# Patient Record
Sex: Male | Born: 1985 | Race: White | Hispanic: No | Marital: Single | Smoking: Current every day smoker
Health system: Southern US, Community
[De-identification: ages and names within clinical notes are randomized; demographics above are authoritative.]

## PROBLEM LIST (undated history)

## (undated) DIAGNOSIS — M539 Dorsopathy, unspecified: Secondary | ICD-10-CM

## (undated) HISTORY — PX: OTHER SURGICAL HISTORY: SHX169

## (undated) HISTORY — PX: PLEURAL SCARIFICATION: SHX748

## (undated) HISTORY — PX: TONSILLECTOMY: SUR1361

---
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
## 2007-05-15 ENCOUNTER — Ambulatory Visit (HOSPITAL_COMMUNITY): Admission: RE | Admit: 2007-05-15 | Discharge: 2007-05-15 | Payer: Self-pay | Admitting: Family Medicine

## 2008-03-24 ENCOUNTER — Ambulatory Visit (HOSPITAL_COMMUNITY): Admission: RE | Admit: 2008-03-24 | Discharge: 2008-03-24 | Payer: Self-pay | Admitting: Family Medicine

## 2010-08-10 ENCOUNTER — Emergency Department (HOSPITAL_COMMUNITY)
Admission: EM | Admit: 2010-08-10 | Discharge: 2010-08-11 | Disposition: A | Discharge status: Other Acute Inpatient Hospital | Payer: Self-pay | Attending: Emergency Medicine | Admitting: Emergency Medicine

## 2010-08-10 DIAGNOSIS — R45851 Suicidal ideations: Secondary | ICD-10-CM | POA: Insufficient documentation

## 2010-08-10 LAB — BASIC METABOLIC PANEL
BUN: 7 mg/dL (ref 6–23)
Calcium: 9.5 mg/dL (ref 8.4–10.5)
Creatinine, Ser: 1.3 mg/dL (ref 0.4–1.5)
GFR calc non Af Amer: >60 mL/min (ref >60)
Sodium: 142 mEq/L (ref 135–145)

## 2010-08-10 LAB — DIFFERENTIAL
Basophils Absolute: 0 10*3/uL (ref 0.0–0.1)
Basophils Relative: 0 % (ref 0–1)
Eosinophils Relative: 1 % (ref 0–5)
Neutrophils Relative %: 64 % (ref 43–77)

## 2010-08-10 LAB — RAPID URINE DRUG SCREEN, HOSP PERFORMED
Cocaine: NOT DETECTED
Opiates: NOT DETECTED
Tetrahydrocannabinol: POSITIVE — AB

## 2010-08-10 LAB — CBC
HCT: 47.3 % (ref 39.0–52.0)
Hemoglobin: 15.9 g/dL (ref 13.0–17.0)
MCH: 29.8 pg (ref 26.0–34.0)
WBC: 10.9 10*3/uL — ABNORMAL HIGH (ref 4.0–10.5)

## 2010-08-11 ENCOUNTER — Inpatient Hospital Stay (HOSPITAL_COMMUNITY)
Admission: EM | Admit: 2010-08-11 | Discharge: 2010-08-12 | DRG: 882 | Disposition: A | Discharge status: Home / Self Care | Payer: 59 | Source: Ambulatory Visit | Attending: Psychiatry | Admitting: Psychiatry

## 2010-08-11 DIAGNOSIS — S61509A Unspecified open wound of unspecified wrist, initial encounter: Secondary | ICD-10-CM

## 2010-08-11 DIAGNOSIS — F1220 Cannabis dependence, uncomplicated: Secondary | ICD-10-CM

## 2010-08-11 DIAGNOSIS — X838XXA Intentional self-harm by other specified means, initial encounter: Secondary | ICD-10-CM

## 2010-08-11 DIAGNOSIS — Z818 Family history of other mental and behavioral disorders: Secondary | ICD-10-CM

## 2010-08-11 DIAGNOSIS — F4325 Adjustment disorder with mixed disturbance of emotions and conduct: Secondary | ICD-10-CM

## 2010-08-11 DIAGNOSIS — F4323 Adjustment disorder with mixed anxiety and depressed mood: Principal | ICD-10-CM

## 2010-08-11 DIAGNOSIS — Z880 Allergy status to penicillin: Secondary | ICD-10-CM

## 2010-08-11 DIAGNOSIS — F1420 Cocaine dependence, uncomplicated: Secondary | ICD-10-CM

## 2010-08-11 DIAGNOSIS — F17200 Nicotine dependence, unspecified, uncomplicated: Secondary | ICD-10-CM

## 2010-08-11 NOTE — H&P (Signed)
Connor [name], Connor [name]              ACCOUNT NO.:  [id number]  MEDICAL RECORD NO.:  [id number]           PATIENT TYPE:  I  LOCATION:  0504                          FACILITY:  [facility]  PHYSICIAN:  Marlis Edelson, DO        DATE OF BIRTH:  08-Jun-1985  DATE OF ADMISSION:  08/11/2010 DATE OF DISCHARGE:                      PSYCHIATRIC ADMISSION ASSESSMENT   CHIEF COMPLAINT:  Cut self.  HISTORY OF THE CHIEF COMPLAINT:  Connor [name] is a 25 year old Caucasian male who was admitted to the [facility] on August 11, 2010 following transfer involuntary status from the [facility] emergency department.  He had presented to that department by police after cutting himself in what was believed to be a suicidal attempt.  It was related that he cut himself in an attempt to bleed out. He had been a 5-year relationship with a young lady who basically broke up with him.  He related that after this episode had occurred he was sitting in back of the house smoking marijuana, he was also drinking beer.  He reports that his mother and stepfather came over, began to make an issue about this breakup.  He states that he feels his mother is over protective about him, that his 25 years old and can care for himself but she tends to be he states over enmeshed with him.  He then picked up a knife and cut himself very superficially on the left wrist out of what he describes as frustration.  His mother called 9-1-1.  He left the premises and actually cut his arm worse when he jumped over a barbed wire fence at his friend's house.  He reports that he wanted to have died he would cut himself more seriously than a very superficial scratch.  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  If he wanted to commit suicide, he would have simply shot himself.  PAST PSYCHIATRIC HISTORY:  He states he was diagnosed with ADHD as a child.  He was provided with Adderall but he stated it made him  very despondent.  He has had no history of previous hospitalizations and he relates no history of suicide attempts.  He had no history of cutting himself to relieve the tension or pressure.  MEDICAL HISTORY:  Unremarkable.  MEDICATIONS:  None.  ALLERGIES:  PENICILLIN which causes anaphylactic shock, BENADRYL which causes nausea and vomiting.  SOCIAL HISTORY:  He is single, never married.  No children.  Education 9th grade and he did obtain a GED.  He does various odds and ends for work with no full-time employment and has no history of Financial planner.  No history of legal entanglement other than a concealed carry violation when he was younger.  He relates no history of childhood abuse.  FAMILY HISTORY:  Reports his mother is bipolar, that there is a lot of family illness, although he cannot be specific as to those illnesses among family members.  SUBSTANCE USE HISTORY:  He smokes 4 packs of cigarettes per day and has been smoking since the age of 29.  He uses marijuana daily consuming about  1 ounce daily and has been smoking marijuana since he was 25 years of age.  He relates that around the age of 10, he used cocaine excessively his last use he reports as 4 to 5 years ago.  Alcohol consumption varies.  Sometimes he will drink a lot depending on stressors.  Other times he may go a week or more without drinking.  MENTAL STATUS EXAM:  He is well-developed, well-nourished, no acute distress.  He was cooperative with the examination although noted to be a bit agitated over the situation.  His eye contact was appropriate. Speech clear, coherent, regular rate, rhythm, volume and tone.  His mood as stated is edgy.  His affect is consistent with the agitation although he was polite with his examiner during the course of the hospitalization.  He at this point is best described as being frustrated over the situation of being involuntarily committed for what he saw as a non serious  event.  His thought process is linear.  He is goal-directed. Cognitively he is intact.  His judgment is obviously impaired.  His insight is shallow.  Psychomotor activity was within normal limits.  He denies any perceptual symptoms, ideas of reference, paranoia or delusions.  No suicidal or homicidal thought intent or plan.  Urine drug screen In the emergency room was remarkable for marijuana, blood alcohol level was 73 mg/dL.  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 AXIS II:  Deferred. AXIS III:  None. AXIS IV:  Relationship stress and loss. AXIS V:  45.  TREATMENT PLAN:  He is admitted to the adult unit where he will be integrated into the milieu and group.  He does not see a need for medication at this point without we will defer any medications.  We will go through the appropriate safety measures including contact and family members regarding safety and aftercare.          ______________________________ Marlis Edelson, DO     DB/MEDQ  D:  08/11/2010  T:  08/11/2010  Job:  [id number]  Electronically Signed by Marlis Edelson MD on 08/11/2010 09:40:54 PM

## 2010-08-12 NOTE — Discharge Summary (Signed)
Connor [name]              ACCOUNT NO.:  [id number]  MEDICAL RECORD NO.:  [id number]           PATIENT TYPE:  I  LOCATION:  0504                          FACILITY:  [facility]  PHYSICIAN:  Marlis Edelson, DO        DATE OF BIRTH:  [birth year]  DATE OF ADMISSION:  08/11/2010 DATE OF DISCHARGE:  08/12/2010                              DISCHARGE SUMMARY   CHIEF COMPLAINT:  Status post cutting.  HISTORY OF CHIEF COMPLAINT:  Connor [name] is a 25 year old Caucasian male who was admitted following evaluation at the [facility] emergency department after what was reported as a suicidal attempt. Connor [name] had been expressing stressors because his girlfriend broke up with him.  His mother and stepfather came over after the breakup.  He said he was sitting outside and was drinking.  He became upset and cut himself very superficially with a knife on his left wrist.  His mother called 9-1-1.  He left to a friend's house, jumped over a barbed wire fence and actually scraped his arm worse than his cutting incident.  He reported that this was not suicidal attempt.  It was frustration.  He reports that he owns guns and if he had wanted to commit suicide he would have shot himself or cut himself more seriously.  He has had no previous history of suicide attempts.  At the time of the interview, he was not relating any depressive symptoms and did not relate depressive symptoms during the course of his hospitalization.  He did not report any significant anxiety.  There was no evidence of psychosis.  PAST PSYCHIATRIC HISTORY:  His past psychiatric history was consistent with ADHD in childhood.  He has had no previous psychiatric hospitalizations and no suicidal attempts.  No history of cutting himself or other forms of self-mutilation.  He has a history of cocaine dependence, using cocaine excessively around the age of 66.  He states now he does drink, but his alcohol consumption varies.  The  longest time he has drank has been one week straight, and he states that there are many times when he does not drink at all.  He does have a history of daily marijuana use since the age of 7.  He does smoke excessively at four packs per day.  HOSPITAL COURSE:  Mr. Krotzer was admitted to the adult unit where he was integrated into group psychotherapy and milieu.  He did participate in unit activities including recreation.  He was observed to be interactive with his peers.  There was no behavioral discord while on the unit with the exception of him having issues over being unable to smoke with him being a four pack per day smoker.  He did not manifest any symptoms of hypomania, mania or psychosis.  There were no symptoms of significant substance withdrawal.  He displayed no behaviors of self- harm or of harming others.  His mental status examination remained stable throughout the course of hospitalization.  On the morning of discharge, he was pleasant, cooperative and engaging. He related no suicidal or homicidal thought, intent or plan or thought, intent or  plan of harming himself or others.  He had no hypomania, mania or psychosis.  No withdrawal symptoms from substances.  He established good eye contact.  We did have a telephone conference with his mother. His mother is reporting no current safety concerns, feeling that he has "learned his lesson" and that he should do well if he returns home.  She does not feel that there is any reason to continue his hospitalization at present.  He does not desire current medications and denies depressive symptoms, anxiety symptoms, psychotic symptoms or other major issues at this point.  In addition he does not see the need for treatment of the ADHD at this point.  COMPLICATIONS:  None.  LABORATORY AND IMAGING:  None.  CONSULTATIONS:  None.  MENTAL STATUS EXAM:  At time of discharge, pleasant and cooperative.  He established appropriate eye  contact.  Speech clear, coherent.  Regular rate, rhythm, volume and tone.  His level of alertness was good.  Mood was good.  Affect was congruent and appropriate.  He denies significant depressive symptoms.  Denies anxiety.  Thought process was linear, logical and goal directed.  Thought content without perceptual symptoms, ideas of reference, paranoia or delusions.  He denies any suicidal or homicidal thought, intent or plan.  He is more future directed.  His judgment is improved.  His insight into the situation has improved.  He was cognitively intact.  DISCHARGE DIAGNOSES:  AXIS I:  Adjustment disorder with mixed emotions, marijuana dependence, nicotine dependence, cocaine dependence, in remission. AXIS II:  Deferred. AXIS III:  None. AXIS IV:  Recent relationship loss. AXIS V:  58.  DISCHARGE MEDICATIONS:  None.  DISCHARGE INSTRUCTIONS: 1. Return to hospital for any marked change in mood or affect or the     development of suicidal or homicidal thought, intent or plan. 2. Follow up at [facility] in [place], [place], as     outlined.  CONDITION ON DISCHARGE:  Improved with no evidence of hypomania, maniaor psychosis.  No suicidal or homicidal thought, intent or plan.  No thought, intent or plan of harming self or others.  PROGNOSIS:  Fair.          ______________________________ Marlis Edelson, DO     DB/MEDQ  D:  08/12/2010  T:  08/12/2010  Job:  [id number]  Electronically Signed by Marlis Edelson MD on 08/12/2010 09:21:11 PM

## 2010-10-15 NOTE — Op Note (Signed)
. [facility]  Patient:    Connor [name], Connor [name] Visit Number: [id number] MRN: [id number]          Service Type: END Location: DAY Attending Physician:  Malissa Hippo Proc. Date: 11/04/96 Admit Date:  04/16/2001 Discharge Date: 04/16/2001   CC:         Dr. Lilyan Punt   Operative Report  PREOPERATIVE DIAGNOSIS:  Intermittent gross microscopic hematuria and dysuria.  POSTOPERATIVE DIAGNOSIS:  Anterior urethral valve.  OPERATION:  Cystoscopy and ablation of anterior urethral valve.  SURGEON:  Mark C. Vernie Ammons, M.D.  ASSISTANT:  ANESTHESIA:  General.  DRAINS:  None.  SPECIMENS:  None.  ESTIMATED BLOOD LOSS:  None.  COMPLICATIONS:  None.  INDICATIONS:  The patient is a 25 year old white male child who has had intermittent gross hematuria and dysuria.  Upper tracts are normal by intravenous pyelogram and laboratory studies including BMP, CBC, PTT, PT, ASO, C3, C4 are all normal.  The patient is brought to the operating room for lower tract evaluation.  DESCRIPTION OF PROCEDURE:  After informed consent the patient was brought to the major operating room and placed on the table and administered general anesthesia.  He was then moved to the dorsal lithotomy position and his genitalia were sterilely prepped and draped.  A 13 French pediatric cystoscope was then introduced per urethra and the anterior urethra was noted to be normal down to the area of the proximal bulb where there appeared to be some slight redundant tissue at the 4 and 7 oclock positions of the urethra with some associated mucosal defects.  These were photographed.  The bulbar urethra itself and sphincter appeared normal.  The prostatic urethra revealed no posterior urethral valve and was totally normal.  The bladder itself was fully inspected and noted to have no tumor, stones or inflammatory lesions.  Both ureteral orifices were of normal configuration  and position.  The bladder was then filled to capacity and I then backed the scope to the level where the abnormality had been seen and Crede maneuver produced normal antegrade flow of irrigant around the scope causing the redundant tissue to billow out in a valve-like fashion.  I therefore used a 3 [place] Bugbee electroded placed on pure cut using 10 watts and fulgurated both these valves completely.  I was careful not to fulgurate circumferentially nor deep into the tissue.  This caused complete destruction of these valve like structures with no bleeding.  I then drained the bladder, removed the cystoscope.  The patient was taken to the recovery room in stable satisfactory condition.  He tolerated the procedure well with no complications.  I gave him a prescription for 12 Pyridium 100 mg.  He will follow up in my office in two to three weeks. Attending Physician:  Malissa Hippo DD:  11/04/96 TD:  11/05/96 Job: [id number]

## 2010-10-15 NOTE — Op Note (Signed)
Connor Connor [name], Connor Connor [name]                       ACCOUNT NO.:  [id number]   MEDICAL RECORD NO.:  [id number]                   PATIENT TYPE:  AMB   LOCATION:  [facility]                                 FACILITY:  [facility]   PHYSICIAN:  Mark C. Vernie Ammons, M.D.               DATE OF BIRTH:  24-Sep-1985   DATE OF PROCEDURE:  02/05/2002  DATE OF DISCHARGE:                                 OPERATIVE REPORT   PREOPERATIVE DIAGNOSES:  1. Dysuria.  2. Urinary tract infection  3. Elevated postvoid residual.   PROCEDURE:  1. Cystoscopy.  2. Bilateral retrograde pyelograms with interpretation.  3. Examination under anesthesia.   SURGEON:  Mark C. Vernie Ammons, M.D.   ANESTHESIA:  General.   SPECIMENS:  None.   DRAINS:  None.   ESTIMATED BLOOD LOSS:  None.   COMPLICATIONS:  None.   INDICATIONS:  The patient is Connor [name] 25 year old white male who many years ago as  an infant underwent resection of posterior urethral valves.  Since then he  had one UTI back in 2001 with possible mild left pyelonephritis.  He was  seen by me on 01/23/02 with dysuria, small frequent voidings, and was found  to have culture-proven UTI.  He was placed on appropriate antibiotics but  continues to have dysuria.  He was found yesterday to have 220 cc PVR by  ultrasound and continues to have voiding symptoms, although his urine  appears to be clearing now.  There was slow clearing of the bacteria from  his urine initially.  He is afebrile and is brought to the OR today for  cystoscopy in order to rule out possible urethral stricture since he has had  prior urethral instrumentation, and other abnormalities that could be  causing his persistent infection.   DESCRIPTION OF PROCEDURE:  After informed consent, the patient was brought  to the major OR and placed on the table and administered general anesthesia,  then moved to the dorsal lithotomy position.  His genitalia were sterilely  prepped and draped, and examination under anesthesia  revealed normal phallus  with Connor [name] normal meatus, adequate size.  Scrotum, testicles, epididymes were  palpably normal as well.  He has normal rectal tone.  His prostate is small,  smooth, symmetric, and free of fluctuance, mass, induration, fixation, or  other abnormality, and no abnormalities of the seminal vesicles were  palpable.  No other rectal masses were identified.   I initially performed cystoscopy using the 17 French scope and 12 degree  lens.  The urethra was noted to be entirely normal down to the very distal  bulbar urethral region, where Connor [name] circumferential mild scar was noted, but it  easily allowed passage of the 17 [place] scope.  The sphincter was intact.  The prostatic urethra was noted to be entirely normal.  The area of the  posterior urethral valves were resected and were free of any redundant valve  tissue.  The bladder was then entered.  It was noted to be lined with normal-  appearing pink mucosa, and it was free of any tumors, stones, or  inflammatory lesions.  the ureteral orifices were of normal configuration  and position with clear efflux bilaterally.  There was no evidence of  trabeculation.   Because no abnormality was detected, I felt complete and thorough evaluation  under anesthesia was indicated and therefore performed bilateral retrograde  pyelograms using Connor [name] 6 French open-ended ureteral catheter.  This study  reveals normal ureters on both sides, normal intrarenal collecting systems  on both sides, and no filling defects, mass effect, or abnormalities in the  course or caliber of either ureter.  It is my impression that this is an  entirely normal bilateral retrograde pyelogram.   IMPRESSION:  Recent urinary tract infection with slow clearing, but he does  appear to be improving.  With no obstruction or other anatomic abnormality,  I am going to continue him on his antibiotics and see him back in the office  in one week for reassessment.                                                Mark C. Vernie Ammons, M.D.    MCO/MEDQ  D:  02/05/2002  T:  02/05/2002  Job:  [id number]

## 2013-10-21 ENCOUNTER — Emergency Department (HOSPITAL_COMMUNITY): Payer: No Typology Code available for payment source

## 2013-10-21 ENCOUNTER — Inpatient Hospital Stay (HOSPITAL_COMMUNITY): Payer: No Typology Code available for payment source

## 2013-10-21 ENCOUNTER — Encounter (HOSPITAL_COMMUNITY): Payer: Self-pay | Admitting: Emergency Medicine

## 2013-10-21 ENCOUNTER — Inpatient Hospital Stay (HOSPITAL_COMMUNITY)
Admission: EM | Admit: 2013-10-21 | Discharge: 2013-10-26 | DRG: 964 | Disposition: A | Discharge status: Home / Self Care | Payer: No Typology Code available for payment source | Attending: General Surgery | Admitting: General Surgery

## 2013-10-21 DIAGNOSIS — F17200 Nicotine dependence, unspecified, uncomplicated: Secondary | ICD-10-CM | POA: Diagnosis present

## 2013-10-21 DIAGNOSIS — S3681XA Injury of peritoneum, initial encounter: Secondary | ICD-10-CM

## 2013-10-21 DIAGNOSIS — S025XXA Fracture of tooth (traumatic), initial encounter for closed fracture: Secondary | ICD-10-CM | POA: Diagnosis present

## 2013-10-21 DIAGNOSIS — D62 Acute posthemorrhagic anemia: Secondary | ICD-10-CM | POA: Diagnosis not present

## 2013-10-21 DIAGNOSIS — S270XXA Traumatic pneumothorax, initial encounter: Secondary | ICD-10-CM

## 2013-10-21 DIAGNOSIS — S0242XA Fracture of alveolus of maxilla, initial encounter for closed fracture: Secondary | ICD-10-CM | POA: Diagnosis present

## 2013-10-21 DIAGNOSIS — S032XXA Dislocation of tooth, initial encounter: Secondary | ICD-10-CM

## 2013-10-21 DIAGNOSIS — S02400A Malar fracture unspecified, initial encounter for closed fracture: Secondary | ICD-10-CM | POA: Diagnosis present

## 2013-10-21 DIAGNOSIS — S02401A Maxillary fracture, unspecified, initial encounter for closed fracture: Secondary | ICD-10-CM | POA: Diagnosis present

## 2013-10-21 DIAGNOSIS — S01511A Laceration without foreign body of lip, initial encounter: Secondary | ICD-10-CM

## 2013-10-21 DIAGNOSIS — R079 Chest pain, unspecified: Secondary | ICD-10-CM

## 2013-10-21 DIAGNOSIS — J939 Pneumothorax, unspecified: Secondary | ICD-10-CM

## 2013-10-21 DIAGNOSIS — S27329A Contusion of lung, unspecified, initial encounter: Secondary | ICD-10-CM

## 2013-10-21 DIAGNOSIS — R0602 Shortness of breath: Secondary | ICD-10-CM

## 2013-10-21 DIAGNOSIS — S36113A Laceration of liver, unspecified degree, initial encounter: Principal | ICD-10-CM | POA: Diagnosis present

## 2013-10-21 HISTORY — DX: Dorsopathy, unspecified: M53.9

## 2013-10-21 LAB — COMPREHENSIVE METABOLIC PANEL
ALT: 163 U/L — ABNORMAL HIGH (ref 0–53)
AST: 249 U/L — ABNORMAL HIGH (ref 0–37)
Albumin: 3.9 g/dL (ref 3.5–5.2)
Alkaline Phosphatase: 75 U/L (ref 39–117)
BUN: 10 mg/dL (ref 6–23)
CALCIUM: 9 mg/dL (ref 8.4–10.5)
CHLORIDE: 103 meq/L (ref 96–112)
CO2: 22 meq/L (ref 19–32)
Creatinine, Ser: 1.43 mg/dL — ABNORMAL HIGH (ref 0.50–1.35)
GFR, EST AFRICAN AMERICAN: 77 mL/min — AB (ref >90)
GFR, EST NON AFRICAN AMERICAN: 66 mL/min — AB (ref >90)
GLUCOSE: 120 mg/dL — AB (ref 70–99)
Potassium: 3.8 mEq/L (ref 3.7–5.3)
SODIUM: 140 meq/L (ref 137–147)
Total Bilirubin: 0.5 mg/dL (ref 0.3–1.2)
Total Protein: 6.5 g/dL (ref 6.0–8.3)

## 2013-10-21 LAB — PREPARE FRESH FROZEN PLASMA
UNIT DIVISION: 0
UNIT DIVISION: 0

## 2013-10-21 LAB — TYPE AND SCREEN
ABO/RH(D): O POS
Antibody Screen: NEGATIVE
UNIT DIVISION: 0
Unit division: 0

## 2013-10-21 LAB — CBC
HCT: 43.9 % (ref 39.0–52.0)
HCT: 37.7 % — ABNORMAL LOW (ref 39.0–52.0)
HCT: 39.7 % (ref 39.0–52.0)
HEMATOCRIT: 40 % (ref 39.0–52.0)
HEMOGLOBIN: 12.5 g/dL — AB (ref 13.0–17.0)
HEMOGLOBIN: 13.2 g/dL (ref 13.0–17.0)
Hemoglobin: 14.4 g/dL (ref 13.0–17.0)
Hemoglobin: 13.5 g/dL (ref 13.0–17.0)
MCH: 29.9 pg (ref 26.0–34.0)
MCH: 30.4 pg (ref 26.0–34.0)
MCH: 30.9 pg (ref 26.0–34.0)
MCH: 30.6 pg (ref 26.0–34.0)
MCHC: 32.8 g/dL (ref 30.0–36.0)
MCHC: 33.2 g/dL (ref 30.0–36.0)
MCHC: 33.8 g/dL (ref 30.0–36.0)
MCHC: 33.2 g/dL (ref 30.0–36.0)
MCV: 91.1 fL (ref 78.0–100.0)
MCV: 91.7 fL (ref 78.0–100.0)
MCV: 91.5 fL (ref 78.0–100.0)
MCV: 91.9 fL (ref 78.0–100.0)
Platelets: 246 10*3/uL (ref 150–400)
Platelets: 195 10*3/uL (ref 150–400)
Platelets: 181 10*3/uL (ref 150–400)
Platelets: 175 10*3/uL (ref 150–400)
RBC: 4.82 MIL/uL (ref 4.22–5.81)
RBC: 4.11 MIL/uL — AB (ref 4.22–5.81)
RBC: 4.37 MIL/uL (ref 4.22–5.81)
RBC: 4.32 MIL/uL (ref 4.22–5.81)
RDW: 14.5 % (ref 11.5–15.5)
RDW: 14.7 % (ref 11.5–15.5)
RDW: 14.8 % (ref 11.5–15.5)
RDW: 15.1 % (ref 11.5–15.5)
WBC: 18 10*3/uL — ABNORMAL HIGH (ref 4.0–10.5)
WBC: 21.9 10*3/uL — ABNORMAL HIGH (ref 4.0–10.5)
WBC: 15.8 10*3/uL — AB (ref 4.0–10.5)
WBC: 13.4 10*3/uL — ABNORMAL HIGH (ref 4.0–10.5)

## 2013-10-21 LAB — BASIC METABOLIC PANEL
BUN: 11 mg/dL (ref 6–23)
CHLORIDE: 108 meq/L (ref 96–112)
CO2: 17 mEq/L — ABNORMAL LOW (ref 19–32)
CREATININE: 1.16 mg/dL (ref 0.50–1.35)
Calcium: 7.8 mg/dL — ABNORMAL LOW (ref 8.4–10.5)
GFR, EST NON AFRICAN AMERICAN: 85 mL/min — AB (ref >90)
Glucose, Bld: 95 mg/dL (ref 70–99)
Potassium: 4 mEq/L (ref 3.7–5.3)
Sodium: 141 mEq/L (ref 137–147)

## 2013-10-21 LAB — CDS SEROLOGY

## 2013-10-21 LAB — ABO/RH: ABO/RH(D): O POS

## 2013-10-21 LAB — PROTIME-INR
INR: 1.06 (ref 0.00–1.49)
PROTHROMBIN TIME: 13.6 s (ref 11.6–15.2)

## 2013-10-21 LAB — MRSA PCR SCREENING: MRSA by PCR: NEGATIVE

## 2013-10-21 LAB — ETHANOL: ALCOHOL ETHYL (B): 91 mg/dL — AB (ref 0–11)

## 2013-10-21 MED ORDER — MORPHINE SULFATE 4 MG/ML IJ SOLN: 4.0000 mg | INTRAMUSCULAR | Status: DC | PRN

## 2013-10-21 MED ORDER — PANTOPRAZOLE SODIUM 40 MG PO TBEC
40.0000 mg | DELAYED_RELEASE_TABLET | Freq: Every day | ORAL | Status: DC
  Administered 2013-10-22 – 2013-10-23 (×2): 40 mg via ORAL
  Filled 2013-10-21 (×2): qty 1

## 2013-10-21 MED ORDER — MORPHINE SULFATE 2 MG/ML IJ SOLN
INTRAMUSCULAR | Status: AC
  Administered 2013-10-21: 4 mg via INTRAVENOUS
  Filled 2013-10-21: qty 2

## 2013-10-21 MED ORDER — BIOTENE DRY MOUTH MT LIQD
15.0000 mL | Freq: Two times a day (BID) | OROMUCOSAL | Status: DC
  Administered 2013-10-21 – 2013-10-26 (×10): 15 mL via OROMUCOSAL

## 2013-10-21 MED ORDER — IOHEXOL 300 MG/ML  SOLN
100.0000 mL | Freq: Once | INTRAMUSCULAR | Status: AC | PRN
  Administered 2013-10-21: 100 mL via INTRAVENOUS

## 2013-10-21 MED ORDER — SODIUM CHLORIDE 0.9 % IV SOLN
Freq: Once | INTRAVENOUS | Status: AC
  Administered 2013-10-21: 03:00:00 via INTRAVENOUS

## 2013-10-21 MED ORDER — MORPHINE SULFATE 2 MG/ML IJ SOLN
2.0000 mg | INTRAMUSCULAR | Status: DC | PRN
  Administered 2013-10-21: 1 mg via INTRAVENOUS
  Administered 2013-10-22: 2 mg via INTRAVENOUS
  Administered 2013-10-22: 1 mg via INTRAVENOUS
  Administered 2013-10-23 (×2): 2 mg via INTRAVENOUS
  Filled 2013-10-21 (×6): qty 1

## 2013-10-21 MED ORDER — BIOTENE DRY MOUTH MT LIQD: 15.0000 mL | Freq: Two times a day (BID) | OROMUCOSAL | Status: DC

## 2013-10-21 MED ORDER — ONDANSETRON HCL 4 MG PO TABS
4.0000 mg | ORAL_TABLET | Freq: Four times a day (QID) | ORAL | Status: DC | PRN
  Administered 2013-10-24 – 2013-10-25 (×4): 4 mg via ORAL
  Filled 2013-10-21 (×6): qty 1

## 2013-10-21 MED ORDER — MORPHINE SULFATE 4 MG/ML IJ SOLN: 4.0000 mg | Freq: Once | INTRAMUSCULAR | Status: DC

## 2013-10-21 MED ORDER — HYDROMORPHONE HCL PF 1 MG/ML IJ SOLN
1.0000 mg | INTRAMUSCULAR | Status: DC | PRN
  Administered 2013-10-22 – 2013-10-23 (×3): 1 mg via INTRAVENOUS
  Filled 2013-10-21 (×3): qty 1

## 2013-10-21 MED ORDER — ONDANSETRON HCL 4 MG/2ML IJ SOLN
4.0000 mg | Freq: Four times a day (QID) | INTRAMUSCULAR | Status: DC | PRN
  Administered 2013-10-22: 4 mg via INTRAVENOUS
  Filled 2013-10-21: qty 2

## 2013-10-21 MED ORDER — PANTOPRAZOLE SODIUM 40 MG IV SOLR
40.0000 mg | Freq: Every day | INTRAVENOUS | Status: DC
  Administered 2013-10-21: 40 mg via INTRAVENOUS
  Filled 2013-10-21 (×4): qty 40

## 2013-10-21 MED ORDER — ONDANSETRON HCL 4 MG/2ML IJ SOLN
4.0000 mg | Freq: Once | INTRAMUSCULAR | Status: AC
  Administered 2013-10-21: 4 mg via INTRAVENOUS

## 2013-10-21 MED ORDER — MORPHINE SULFATE 2 MG/ML IJ SOLN
1.0000 mg | INTRAMUSCULAR | Status: DC | PRN
  Administered 2013-10-21 – 2013-10-22 (×9): 1 mg via INTRAVENOUS
  Filled 2013-10-21 (×8): qty 1

## 2013-10-21 MED ORDER — POTASSIUM CHLORIDE IN NACL 20-0.9 MEQ/L-% IV SOLN
INTRAVENOUS | Status: DC
  Administered 2013-10-21: 21:00:00 via INTRAVENOUS
  Administered 2013-10-21 (×2): 125 mL/h via INTRAVENOUS
  Administered 2013-10-22 – 2013-10-23 (×2): via INTRAVENOUS
  Filled 2013-10-21 (×8): qty 1000

## 2013-10-21 MED ORDER — ONDANSETRON HCL 4 MG/2ML IJ SOLN
INTRAMUSCULAR | Status: AC
  Administered 2013-10-21: 4 mg via INTRAVENOUS
  Filled 2013-10-21: qty 2

## 2013-10-21 NOTE — ED Notes (Signed)
Personal items given to patients mother: (2) black pocket knives, set of keys, Identification card, $1.27 cash, (6) rounds of very small caliber ammunition
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
## 2013-10-21 NOTE — ED Notes (Signed)
All belongings sent home with Mother

## 2013-10-21 NOTE — ED Notes (Signed)
To CT with RN and Dr Magnus Ivan.
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
## 2013-10-22 ENCOUNTER — Inpatient Hospital Stay (HOSPITAL_COMMUNITY): Payer: No Typology Code available for payment source

## 2013-10-22 LAB — CBC
HCT: 36.7 % — ABNORMAL LOW (ref 39.0–52.0)
HCT: 38.1 % — ABNORMAL LOW (ref 39.0–52.0)
HEMATOCRIT: 36.7 % — AB (ref 39.0–52.0)
Hemoglobin: 12.3 g/dL — ABNORMAL LOW (ref 13.0–17.0)
Hemoglobin: 12.4 g/dL — ABNORMAL LOW (ref 13.0–17.0)
Hemoglobin: 12.7 g/dL — ABNORMAL LOW (ref 13.0–17.0)
MCH: 30.8 pg (ref 26.0–34.0)
MCH: 30.8 pg (ref 26.0–34.0)
MCH: 30.8 pg (ref 26.0–34.0)
MCHC: 33.3 g/dL (ref 30.0–36.0)
MCHC: 33.5 g/dL (ref 30.0–36.0)
MCHC: 33.8 g/dL (ref 30.0–36.0)
MCV: 91.3 fL (ref 78.0–100.0)
MCV: 91.8 fL (ref 78.0–100.0)
MCV: 92.3 fL (ref 78.0–100.0)
Platelets: 151 10*3/uL (ref 150–400)
Platelets: 152 10*3/uL (ref 150–400)
Platelets: 155 10*3/uL (ref 150–400)
RBC: 4 MIL/uL — ABNORMAL LOW (ref 4.22–5.81)
RBC: 4.02 MIL/uL — ABNORMAL LOW (ref 4.22–5.81)
RBC: 4.13 MIL/uL — ABNORMAL LOW (ref 4.22–5.81)
RDW: 14.9 % (ref 11.5–15.5)
RDW: 14.9 % (ref 11.5–15.5)
RDW: 15 % (ref 11.5–15.5)
WBC: 10.9 10*3/uL — AB (ref 4.0–10.5)
WBC: 11.2 10*3/uL — ABNORMAL HIGH (ref 4.0–10.5)
WBC: 13.1 10*3/uL — ABNORMAL HIGH (ref 4.0–10.5)

## 2013-10-22 MED ORDER — OXYCODONE HCL 5 MG PO TABS
5.0000 mg | ORAL_TABLET | ORAL | Status: DC | PRN
  Administered 2013-10-22 – 2013-10-23 (×5): 10 mg via ORAL
  Administered 2013-10-24 (×2): 15 mg via ORAL
  Administered 2013-10-24: 10 mg via ORAL
  Administered 2013-10-24 – 2013-10-26 (×6): 15 mg via ORAL
  Filled 2013-10-22: qty 2
  Filled 2013-10-22 (×2): qty 3
  Filled 2013-10-22 (×2): qty 2
  Filled 2013-10-22 (×2): qty 3
  Filled 2013-10-22 (×3): qty 2
  Filled 2013-10-22 (×4): qty 3

## 2013-10-22 MED ORDER — IPRATROPIUM-ALBUTEROL 0.5-2.5 (3) MG/3ML IN SOLN
3.0000 mL | Freq: Four times a day (QID) | RESPIRATORY_TRACT | Status: DC | PRN
  Administered 2013-10-24: 3 mL via RESPIRATORY_TRACT
  Filled 2013-10-22: qty 3

## 2013-10-22 NOTE — Progress Notes (Deleted)
a 
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
## 2013-10-23 LAB — CBC
HCT: 34.1 % — ABNORMAL LOW (ref 39.0–52.0)
Hemoglobin: 11.5 g/dL — ABNORMAL LOW (ref 13.0–17.0)
MCH: 30.8 pg (ref 26.0–34.0)
MCHC: 33.7 g/dL (ref 30.0–36.0)
MCV: 91.4 fL (ref 78.0–100.0)
PLATELETS: 138 10*3/uL — AB (ref 150–400)
RBC: 3.73 MIL/uL — ABNORMAL LOW (ref 4.22–5.81)
RDW: 14.6 % (ref 11.5–15.5)
WBC: 9.2 10*3/uL (ref 4.0–10.5)

## 2013-10-23 LAB — HEMOGLOBIN AND HEMATOCRIT, BLOOD
HCT: 37.3 % — ABNORMAL LOW (ref 39.0–52.0)
HEMOGLOBIN: 12.5 g/dL — AB (ref 13.0–17.0)

## 2013-10-23 MED ORDER — TRAMADOL HCL 50 MG PO TABS
50.0000 mg | ORAL_TABLET | Freq: Four times a day (QID) | ORAL | Status: DC
  Administered 2013-10-23 – 2013-10-24 (×4): 50 mg via ORAL
  Filled 2013-10-23 (×4): qty 1
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
## 2013-10-24 ENCOUNTER — Inpatient Hospital Stay (HOSPITAL_COMMUNITY): Payer: No Typology Code available for payment source

## 2013-10-24 DIAGNOSIS — S0242XA Fracture of alveolus of maxilla, initial encounter for closed fracture: Secondary | ICD-10-CM | POA: Diagnosis present

## 2013-10-24 DIAGNOSIS — D62 Acute posthemorrhagic anemia: Secondary | ICD-10-CM

## 2013-10-24 DIAGNOSIS — S270XXA Traumatic pneumothorax, initial encounter: Secondary | ICD-10-CM | POA: Diagnosis present

## 2013-10-24 DIAGNOSIS — S032XXA Dislocation of tooth, initial encounter: Secondary | ICD-10-CM | POA: Diagnosis present

## 2013-10-24 LAB — CBC
HCT: 34.4 % — ABNORMAL LOW (ref 39.0–52.0)
Hemoglobin: 11.7 g/dL — ABNORMAL LOW (ref 13.0–17.0)
MCH: 31 pg (ref 26.0–34.0)
MCHC: 34 g/dL (ref 30.0–36.0)
MCV: 91 fL (ref 78.0–100.0)
PLATELETS: 149 10*3/uL — AB (ref 150–400)
RBC: 3.78 MIL/uL — ABNORMAL LOW (ref 4.22–5.81)
RDW: 14.2 % (ref 11.5–15.5)
WBC: 7.4 10*3/uL (ref 4.0–10.5)

## 2013-10-24 MED ORDER — DOCUSATE SODIUM 100 MG PO CAPS
100.0000 mg | ORAL_CAPSULE | Freq: Two times a day (BID) | ORAL | Status: DC
  Administered 2013-10-24 – 2013-10-25 (×4): 100 mg via ORAL
  Filled 2013-10-24 (×5): qty 1

## 2013-10-24 MED ORDER — POLYETHYLENE GLYCOL 3350 17 G PO PACK
17.0000 g | PACK | Freq: Every day | ORAL | Status: DC
  Administered 2013-10-24 – 2013-10-25 (×2): 17 g via ORAL
  Filled 2013-10-24 (×4): qty 1

## 2013-10-24 MED ORDER — MORPHINE SULFATE 2 MG/ML IJ SOLN
2.0000 mg | INTRAMUSCULAR | Status: DC | PRN
Start: 2013-10-24 — End: 2013-10-26

## 2013-10-24 MED ORDER — TRAMADOL HCL 50 MG PO TABS
100.0000 mg | ORAL_TABLET | Freq: Four times a day (QID) | ORAL | Status: DC
  Administered 2013-10-24 – 2013-10-26 (×8): 100 mg via ORAL
  Filled 2013-10-24 (×8): qty 2
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
## 2013-10-24 NOTE — Progress Notes (Signed)
UR completed.  Dezmond Downie, RN BSN MHA CCM Trauma/Neuro ICU Case Manager [phone]  

## 2013-10-24 NOTE — Progress Notes (Signed)
OMFS eval P. I spoke with his mother. Up to chair.Works around [employer] so will need to be out of work for 3 months. Patient examined and I agree with the assessment and plan  Violeta Gelinas, MD, MPH, FACS Trauma: [phone] General Surgery: [phone]  10/24/2013 9:58 AM
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
## 2013-10-25 LAB — CBC
HEMATOCRIT: 35.2 % — AB (ref 39.0–52.0)
Hemoglobin: 11.8 g/dL — ABNORMAL LOW (ref 13.0–17.0)
MCH: 30.7 pg (ref 26.0–34.0)
MCHC: 33.5 g/dL (ref 30.0–36.0)
MCV: 91.7 fL (ref 78.0–100.0)
Platelets: 177 10*3/uL (ref 150–400)
RBC: 3.84 MIL/uL — ABNORMAL LOW (ref 4.22–5.81)
RDW: 13.9 % (ref 11.5–15.5)
WBC: 6.9 10*3/uL (ref 4.0–10.5)

## 2013-10-25 NOTE — Progress Notes (Signed)
Pt. Ambulated around unit twice and tolerated well.  Will continue to monitor. Jonette Wassel L Thompson  
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
## 2013-10-25 NOTE — Discharge Instructions (Signed)
No running, jumping, ball or contact sports, bikes, skateboards, excessive vibrations for 3 months.  No driving while taking narcotic pain medication.

## 2013-10-25 NOTE — Progress Notes (Signed)
Hb stable so will allow ambulation. Plan D/C tomorrow if does well. Noted plan for F/U with Dr. Chales Salmon Monday. Patient examined and I agree with the assessment and plan  Violeta Gelinas, MD, MPH, FACS Trauma: [phone] General Surgery: [phone]  10/25/2013 10:32 AM
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
## 2013-10-26 LAB — CBC
HCT: 38.3 % — ABNORMAL LOW (ref 39.0–52.0)
HEMOGLOBIN: 12.6 g/dL — AB (ref 13.0–17.0)
MCH: 30.2 pg (ref 26.0–34.0)
MCHC: 32.9 g/dL (ref 30.0–36.0)
MCV: 91.8 fL (ref 78.0–100.0)
Platelets: 219 10*3/uL (ref 150–400)
RBC: 4.17 MIL/uL — AB (ref 4.22–5.81)
RDW: 13.7 % (ref 11.5–15.5)
WBC: 7.1 10*3/uL (ref 4.0–10.5)

## 2013-10-26 MED ORDER — TRAMADOL HCL 50 MG PO TABS: 100.0000 mg | ORAL_TABLET | Freq: Four times a day (QID) | ORAL | Status: DC

## 2013-10-26 MED ORDER — BIOTENE DRY MOUTH MT LIQD: 15.0000 mL | Freq: Two times a day (BID) | OROMUCOSAL | Status: DC

## 2013-10-26 MED ORDER — OXYCODONE HCL 5 MG PO TABS: 5.0000 mg | ORAL_TABLET | ORAL | Status: DC | PRN
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
## 2013-10-26 NOTE — Progress Notes (Signed)
Discharge instructions gone over with patient. Prescription given. Home medications gone over. Follow up appointment is made. Diet and activity discussed. Patient verbalized understanding of all.

## 2013-12-20 ENCOUNTER — Other Ambulatory Visit (HOSPITAL_COMMUNITY): Payer: Self-pay | Admitting: Pulmonary Disease

## 2013-12-20 ENCOUNTER — Ambulatory Visit (HOSPITAL_COMMUNITY)
Admission: RE | Admit: 2013-12-20 | Discharge: 2013-12-20 | Disposition: A | Discharge status: Home / Self Care | Payer: BC Managed Care – PPO | Source: Ambulatory Visit | Attending: Pulmonary Disease | Admitting: Pulmonary Disease

## 2013-12-20 DIAGNOSIS — R079 Chest pain, unspecified: Secondary | ICD-10-CM | POA: Insufficient documentation

## 2013-12-26 ENCOUNTER — Other Ambulatory Visit (HOSPITAL_COMMUNITY): Payer: Self-pay | Admitting: Pulmonary Disease

## 2013-12-26 DIAGNOSIS — S36113A Laceration of liver, unspecified degree, initial encounter: Secondary | ICD-10-CM

## 2013-12-30 ENCOUNTER — Ambulatory Visit (HOSPITAL_COMMUNITY)
Admission: RE | Admit: 2013-12-30 | Discharge: 2013-12-30 | Disposition: A | Discharge status: Home / Self Care | Payer: BC Managed Care – PPO | Source: Ambulatory Visit | Attending: Pulmonary Disease | Admitting: Pulmonary Disease

## 2013-12-30 DIAGNOSIS — K7689 Other specified diseases of liver: Secondary | ICD-10-CM | POA: Insufficient documentation

## 2013-12-30 DIAGNOSIS — S36113A Laceration of liver, unspecified degree, initial encounter: Secondary | ICD-10-CM

## 2013-12-30 DIAGNOSIS — Z09 Encounter for follow-up examination after completed treatment for conditions other than malignant neoplasm: Secondary | ICD-10-CM | POA: Insufficient documentation

## 2013-12-30 DIAGNOSIS — I319 Disease of pericardium, unspecified: Secondary | ICD-10-CM | POA: Insufficient documentation

## 2013-12-30 MED ORDER — IOHEXOL 300 MG/ML  SOLN
100.0000 mL | Freq: Once | INTRAMUSCULAR | Status: AC | PRN
  Administered 2013-12-30: 100 mL via INTRAVENOUS

## 2014-01-04 ENCOUNTER — Emergency Department (HOSPITAL_COMMUNITY): Payer: BC Managed Care – PPO

## 2014-01-04 ENCOUNTER — Emergency Department (HOSPITAL_COMMUNITY)
Admission: EM | Admit: 2014-01-04 | Discharge: 2014-01-04 | Disposition: A | Discharge status: Home / Self Care | Payer: BC Managed Care – PPO | Attending: Emergency Medicine | Admitting: Emergency Medicine

## 2014-01-04 ENCOUNTER — Encounter (HOSPITAL_COMMUNITY): Payer: Self-pay | Admitting: Emergency Medicine

## 2014-01-04 DIAGNOSIS — R0609 Other forms of dyspnea: Secondary | ICD-10-CM | POA: Insufficient documentation

## 2014-01-04 DIAGNOSIS — R071 Chest pain on breathing: Secondary | ICD-10-CM | POA: Insufficient documentation

## 2014-01-04 DIAGNOSIS — Z87828 Personal history of other (healed) physical injury and trauma: Secondary | ICD-10-CM | POA: Insufficient documentation

## 2014-01-04 DIAGNOSIS — F17200 Nicotine dependence, unspecified, uncomplicated: Secondary | ICD-10-CM | POA: Insufficient documentation

## 2014-01-04 DIAGNOSIS — R0989 Other specified symptoms and signs involving the circulatory and respiratory systems: Secondary | ICD-10-CM | POA: Insufficient documentation

## 2014-01-04 DIAGNOSIS — Z880 Allergy status to penicillin: Secondary | ICD-10-CM | POA: Insufficient documentation

## 2014-01-04 DIAGNOSIS — R079 Chest pain, unspecified: Secondary | ICD-10-CM

## 2014-01-04 DIAGNOSIS — R0602 Shortness of breath: Secondary | ICD-10-CM | POA: Insufficient documentation

## 2014-01-04 LAB — CBC WITH DIFFERENTIAL/PLATELET
BASOS PCT: 0 % (ref 0–1)
Basophils Absolute: 0 10*3/uL (ref 0.0–0.1)
EOS ABS: 0.1 10*3/uL (ref 0.0–0.7)
Eosinophils Relative: 1 % (ref 0–5)
HEMATOCRIT: 41 % (ref 39.0–52.0)
HEMOGLOBIN: 13.7 g/dL (ref 13.0–17.0)
LYMPHS ABS: 1.5 10*3/uL (ref 0.7–4.0)
LYMPHS PCT: 12 % (ref 12–46)
MCH: 29.1 pg (ref 26.0–34.0)
MCHC: 33.4 g/dL (ref 30.0–36.0)
MCV: 87 fL (ref 78.0–100.0)
Monocytes Absolute: 1.4 10*3/uL — ABNORMAL HIGH (ref 0.1–1.0)
Monocytes Relative: 12 % (ref 3–12)
NEUTROS PCT: 75 % (ref 43–77)
Neutro Abs: 9.1 10*3/uL — ABNORMAL HIGH (ref 1.7–7.7)
PLATELETS: 424 10*3/uL — AB (ref 150–400)
RBC: 4.71 MIL/uL (ref 4.22–5.81)
RDW: 13.3 % (ref 11.5–15.5)
WBC: 12.1 10*3/uL — AB (ref 4.0–10.5)

## 2014-01-04 LAB — COMPREHENSIVE METABOLIC PANEL
ALT: 29 U/L (ref 0–53)
AST: 27 U/L (ref 0–37)
Albumin: 3.1 g/dL — ABNORMAL LOW (ref 3.5–5.2)
Alkaline Phosphatase: 155 U/L — ABNORMAL HIGH (ref 39–117)
Anion gap: 12 (ref 5–15)
BILIRUBIN TOTAL: 0.5 mg/dL (ref 0.3–1.2)
BUN: 6 mg/dL (ref 6–23)
CALCIUM: 9.2 mg/dL (ref 8.4–10.5)
CHLORIDE: 99 meq/L (ref 96–112)
CO2: 27 mEq/L (ref 19–32)
CREATININE: 0.95 mg/dL (ref 0.50–1.35)
GFR calc non Af Amer: >90 mL/min (ref >90)
GLUCOSE: 116 mg/dL — AB (ref 70–99)
Potassium: 4.6 mEq/L (ref 3.7–5.3)
Sodium: 138 mEq/L (ref 137–147)
Total Protein: 7 g/dL (ref 6.0–8.3)

## 2014-01-04 LAB — TROPONIN I

## 2014-01-04 LAB — D-DIMER, QUANTITATIVE (NOT AT ARMC): D DIMER QUANT: 4.03 ug{FEU}/mL — AB (ref 0.00–0.48)

## 2014-01-04 MED ORDER — TECHNETIUM TC 99M DIETHYLENETRIAME-PENTAACETIC ACID
43.0000 | Freq: Once | INTRAVENOUS | Status: AC | PRN
  Administered 2014-01-04: 43 via INTRAVENOUS

## 2014-01-04 MED ORDER — TECHNETIUM TO 99M ALBUMIN AGGREGATED
6.0000 | Freq: Once | INTRAVENOUS | Status: AC | PRN
  Administered 2014-01-04: 6 via INTRAVENOUS

## 2014-01-04 NOTE — Discharge Instructions (Signed)
Ibuprofen 600 mg every 6 hours as needed for pain.  Return to the emergency department if he develops significant worsening of your symptoms, high fever, or other new and concerning symptoms.   Chest Pain (Nonspecific) It is often hard to give a specific diagnosis for the cause of chest pain. There is always a chance that your pain could be related to something serious, such as a heart attack or a blood clot in the lungs. You need to follow up with your health care provider for further evaluation. CAUSES   Heartburn.  Pneumonia or bronchitis.  Anxiety or stress.  Inflammation around your heart (pericarditis) or lung (pleuritis or pleurisy).  A blood clot in the lung.  A collapsed lung (pneumothorax). It can develop suddenly on its own (spontaneous pneumothorax) or from trauma to the chest.  Shingles infection (herpes zoster virus). The chest wall is composed of bones, muscles, and cartilage. Any of these can be the source of the pain.  The bones can be bruised by injury.  The muscles or cartilage can be strained by coughing or overwork.  The cartilage can be affected by inflammation and become sore (costochondritis). DIAGNOSIS  Lab tests or other studies may be needed to find the cause of your pain. Your health care provider may have you take a test called an ambulatory electrocardiogram (ECG). An ECG records your heartbeat patterns over a 24-hour period. You may also have other tests, such as:  Transthoracic echocardiogram (TTE). During echocardiography, sound waves are used to evaluate how blood flows through your heart.  Transesophageal echocardiogram (TEE).  Cardiac monitoring. This allows your health care provider to monitor your heart rate and rhythm in real time.  Holter monitor. This is a portable device that records your heartbeat and can help diagnose heart arrhythmias. It allows your health care provider to track your heart activity for several days, if  needed.  Stress tests by exercise or by giving medicine that makes the heart beat faster. TREATMENT   Treatment depends on what may be causing your chest pain. Treatment may include:  Acid blockers for heartburn.  Anti-inflammatory medicine.  Pain medicine for inflammatory conditions.  Antibiotics if an infection is present.  You may be advised to change lifestyle habits. This includes stopping smoking and avoiding alcohol, caffeine, and chocolate.  You may be advised to keep your head raised (elevated) when sleeping. This reduces the chance of acid going backward from your stomach into your esophagus. Most of the time, nonspecific chest pain will improve within 2-3 days with rest and mild pain medicine.  HOME CARE INSTRUCTIONS   If antibiotics were prescribed, take them as directed. Finish them even if you start to feel better.  For the next few days, avoid physical activities that bring on chest pain. Continue physical activities as directed.  Do not use any tobacco products, including cigarettes, chewing tobacco, or electronic cigarettes.  Avoid drinking alcohol.  Only take medicine as directed by your health care provider.  Follow your health care provider's suggestions for further testing if your chest pain does not go away.  Keep any follow-up appointments you made. If you do not go to an appointment, you could develop lasting (chronic) problems with pain. If there is any problem keeping an appointment, call to reschedule. SEEK MEDICAL CARE IF:   Your chest pain does not go away, even after treatment.  You have a rash with blisters on your chest.  You have a fever. SEEK IMMEDIATE MEDICAL CARE IF:  You have increased chest pain or pain that spreads to your arm, neck, jaw, back, or abdomen.  You have shortness of breath.  You have an increasing cough, or you cough up blood.  You have severe back or abdominal pain.  You feel nauseous or vomit.  You have severe  weakness.  You faint.  You have chills. This is an emergency. Do not wait to see if the pain will go away. Get medical help at once. Call your local emergency services (911 in [place] Do not drive yourself to the hospital. MAKE SURE YOU:   Understand these instructions.  Will watch your condition.  Will get help right away if you are not doing well or get worse. Document Released: 02/23/2005 Document Revised: 05/21/2013 Document Reviewed: 12/20/2007 [facility] Patient Information 2015 [place], [place]. This information is not intended to replace advice given to you by your health care provider. Make sure you discuss any questions you have with your health care provider.
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
## 2014-01-04 NOTE — ED Notes (Signed)
Pt c/o SOB and chest tightness with productive cough. States s/s started after CT scan on Monday.

## 2014-03-31 ENCOUNTER — Emergency Department (HOSPITAL_COMMUNITY): Payer: BC Managed Care – PPO

## 2014-03-31 ENCOUNTER — Emergency Department (HOSPITAL_COMMUNITY)
Admission: EM | Admit: 2014-03-31 | Discharge: 2014-03-31 | Disposition: A | Discharge status: Home / Self Care | Payer: BC Managed Care – PPO | Attending: Emergency Medicine | Admitting: Emergency Medicine

## 2014-03-31 ENCOUNTER — Encounter (HOSPITAL_COMMUNITY): Payer: Self-pay | Admitting: *Deleted

## 2014-03-31 DIAGNOSIS — R079 Chest pain, unspecified: Secondary | ICD-10-CM | POA: Diagnosis present

## 2014-03-31 DIAGNOSIS — Z880 Allergy status to penicillin: Secondary | ICD-10-CM | POA: Diagnosis not present

## 2014-03-31 DIAGNOSIS — R0789 Other chest pain: Secondary | ICD-10-CM | POA: Diagnosis not present

## 2014-03-31 DIAGNOSIS — R52 Pain, unspecified: Secondary | ICD-10-CM

## 2014-03-31 DIAGNOSIS — Z720 Tobacco use: Secondary | ICD-10-CM | POA: Diagnosis not present

## 2014-03-31 MED ORDER — PREDNISONE 10 MG PO TABS: ORAL_TABLET | ORAL | Status: AC

## 2014-03-31 MED ORDER — HYDROCODONE-ACETAMINOPHEN 5-325 MG PO TABS: 1.0000 | ORAL_TABLET | Freq: Four times a day (QID) | ORAL | Status: DC | PRN

## 2014-03-31 NOTE — Discharge Instructions (Signed)
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
## 2014-03-31 NOTE — ED Notes (Signed)
Pain lt lat ribs since last  week.  No injury known
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
## 2017-10-25 ENCOUNTER — Emergency Department (HOSPITAL_COMMUNITY)
Admission: EM | Admit: 2017-10-25 | Discharge: 2017-10-25 | Disposition: A | Discharge status: Home / Self Care | Payer: Self-pay | Attending: Emergency Medicine | Admitting: Emergency Medicine

## 2017-10-25 ENCOUNTER — Encounter (HOSPITAL_COMMUNITY): Payer: Self-pay | Admitting: Emergency Medicine

## 2017-10-25 ENCOUNTER — Emergency Department (HOSPITAL_COMMUNITY): Payer: Self-pay

## 2017-10-25 DIAGNOSIS — S99912A Unspecified injury of left ankle, initial encounter: Secondary | ICD-10-CM | POA: Insufficient documentation

## 2017-10-25 DIAGNOSIS — X500XXA Overexertion from strenuous movement or load, initial encounter: Secondary | ICD-10-CM | POA: Insufficient documentation

## 2017-10-25 DIAGNOSIS — F17210 Nicotine dependence, cigarettes, uncomplicated: Secondary | ICD-10-CM | POA: Insufficient documentation

## 2017-10-25 DIAGNOSIS — Y939 Activity, unspecified: Secondary | ICD-10-CM | POA: Insufficient documentation

## 2017-10-25 DIAGNOSIS — Y999 Unspecified external cause status: Secondary | ICD-10-CM | POA: Insufficient documentation

## 2017-10-25 DIAGNOSIS — Y929 Unspecified place or not applicable: Secondary | ICD-10-CM | POA: Insufficient documentation

## 2017-10-25 MED ORDER — NAPROXEN 500 MG PO TABS: 500.0000 mg | ORAL_TABLET | Freq: Two times a day (BID) | ORAL | 0 refills | Status: DC
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
## 2017-10-25 NOTE — ED Triage Notes (Signed)
Patient complains of left ankle pain after tripping down porch steps and twisting ankle. States hearing a popping sound.

## 2017-10-25 NOTE — Discharge Instructions (Addendum)
Please read and follow all provided instructions.  You have been seen today for injury to your left ankle.   Tests performed today include: An x-ray of the affected area - does NOT show any broken bones or dislocations.  Vital signs. See below for your results today.   Home care instructions: -- *PRICE in the first 24-48 hours after injury: Protect (with brace, splint, sling), if given by your provider Rest Ice- Do not apply ice pack directly to your skin, place towel or similar between your skin and ice/ice pack. Apply ice for 20 min, then remove for 40 min while awake Compression- Wear brace, elastic bandage, splint as directed by your provider Elevate affected extremity above the level of your heart when not walking around for the first 24-48 hours   Medications:  Naproxen is a nonsteroidal anti-inflammatory medication that will help with pain and swelling. Be sure to take this medication as prescribed with food, 1 pill every 12 hours,  It should be taken with food, as it can cause stomach upset, and more seriously, stomach bleeding. Do not take other nonsteroidal anti-inflammatory medications with this such as Advil, Motrin, or Aleve.   We have prescribed you new medication(s) today. Discuss the medications prescribed today with your pharmacist as they can have adverse effects and interactions with your other medicines including over the counter and prescribed medications. Seek medical evaluation if you start to experience new or abnormal symptoms after taking one of these medicines, seek care immediately if you start to experience difficulty breathing, feeling of your throat closing, facial swelling, or rash as these could be indications of a more serious allergic reaction   Follow-up instructions: Please follow-up with your primary care provider or the provided orthopedic physician (bone specialist) if you continue to have significant pain in 1 week. In this case you may have a more severe  injury that requires further care.   Return instructions:  Please return if your toes or feet are numb or tingling, appear gray or blue, or you have severe pain (also elevate the leg and loosen splint or wrap if you were given one) Please return to the Emergency Department if you experience worsening symptoms.  Please return if you have any other emergent concerns. Additional Information:  Your vital signs today were: BP 118/82 (BP Location: Left Arm)    Pulse 98    Temp 98.5 F (36.9 C) (Oral)    Resp 18    Ht  (1.905 m)    Wt 86.2 kg (190 lb)    SpO2 97%    BMI 23.75 kg/m  If your blood pressure (BP) was elevated above 135/85 this visit, please have this repeated by your doctor within one month. ---------------

## 2020-11-04 ENCOUNTER — Other Ambulatory Visit: Payer: Self-pay

## 2020-11-04 ENCOUNTER — Emergency Department (HOSPITAL_COMMUNITY)
Admission: EM | Admit: 2020-11-04 | Discharge: 2020-11-05 | Disposition: A | Discharge status: Home / Self Care | Payer: Self-pay | Attending: Emergency Medicine | Admitting: Emergency Medicine

## 2020-11-04 ENCOUNTER — Encounter (HOSPITAL_COMMUNITY): Payer: Self-pay

## 2020-11-04 DIAGNOSIS — E860 Dehydration: Secondary | ICD-10-CM | POA: Insufficient documentation

## 2020-11-04 DIAGNOSIS — F17210 Nicotine dependence, cigarettes, uncomplicated: Secondary | ICD-10-CM | POA: Insufficient documentation

## 2020-11-04 DIAGNOSIS — R1084 Generalized abdominal pain: Secondary | ICD-10-CM | POA: Insufficient documentation

## 2020-11-04 DIAGNOSIS — R1110 Vomiting, unspecified: Secondary | ICD-10-CM | POA: Insufficient documentation

## 2020-11-04 LAB — CBC WITH DIFFERENTIAL/PLATELET
Abs Immature Granulocytes: 0.01 10*3/uL (ref 0.00–0.07)
Basophils Absolute: 0 10*3/uL (ref 0.0–0.1)
Basophils Relative: 0 %
Eosinophils Absolute: 0.1 10*3/uL (ref 0.0–0.5)
Eosinophils Relative: 2 %
HCT: 39.4 % (ref 39.0–52.0)
Hemoglobin: 12.7 g/dL — ABNORMAL LOW (ref 13.0–17.0)
Immature Granulocytes: 0 %
Lymphocytes Relative: 22 %
Lymphs Abs: 1.4 10*3/uL (ref 0.7–4.0)
MCH: 28.3 pg (ref 26.0–34.0)
MCHC: 32.2 g/dL (ref 30.0–36.0)
MCV: 87.9 fL (ref 80.0–100.0)
Monocytes Absolute: 0.7 10*3/uL (ref 0.1–1.0)
Monocytes Relative: 10 %
Neutro Abs: 4.3 10*3/uL (ref 1.7–7.7)
Neutrophils Relative %: 66 %
Platelets: 264 10*3/uL (ref 150–400)
RBC: 4.48 MIL/uL (ref 4.22–5.81)
RDW: 17.6 % — ABNORMAL HIGH (ref 11.5–15.5)
WBC: 6.5 10*3/uL (ref 4.0–10.5)
nRBC: 0 % (ref 0.0–0.2)

## 2020-11-04 LAB — COMPREHENSIVE METABOLIC PANEL
ALT: 21 U/L (ref 0–44)
AST: 18 U/L (ref 15–41)
Albumin: 3.6 g/dL (ref 3.5–5.0)
Alkaline Phosphatase: 51 U/L (ref 38–126)
Anion gap: 5 (ref 5–15)
BUN: 14 mg/dL (ref 6–20)
CO2: 27 mmol/L (ref 22–32)
Calcium: 8.8 mg/dL — ABNORMAL LOW (ref 8.9–10.3)
Chloride: 103 mmol/L (ref 98–111)
Creatinine, Ser: 1.13 mg/dL (ref 0.61–1.24)
GFR, Estimated: >60 mL/min (ref >60)
Glucose, Bld: 105 mg/dL — ABNORMAL HIGH (ref 70–99)
Potassium: 4.8 mmol/L (ref 3.5–5.1)
Sodium: 135 mmol/L (ref 135–145)
Total Bilirubin: 0.5 mg/dL (ref 0.3–1.2)
Total Protein: 5.9 g/dL — ABNORMAL LOW (ref 6.5–8.1)

## 2020-11-04 LAB — LIPASE, BLOOD: Lipase: 23 U/L (ref 11–51)

## 2020-11-04 MED ORDER — SODIUM CHLORIDE 0.9 % IV BOLUS
1000.0000 mL | Freq: Once | INTRAVENOUS | Status: AC
  Administered 2020-11-04: 1000 mL via INTRAVENOUS

## 2020-11-04 MED ORDER — ONDANSETRON 4 MG PO TBDP: ORAL_TABLET | ORAL | 0 refills | Status: AC

## 2020-11-04 MED ORDER — ONDANSETRON HCL 4 MG/2ML IJ SOLN
4.0000 mg | Freq: Once | INTRAMUSCULAR | Status: AC
  Administered 2020-11-04: 4 mg via INTRAVENOUS
  Filled 2020-11-04: qty 2
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
## 2020-11-04 NOTE — ED Triage Notes (Signed)
Pt to er, pt states that he is here for nausea and vomiting, states that his son was sick with a gi bug a couple of days ago, states that he started feeling poorly last night, states that he continues to have the vomiting today.  Pt denies diarrhea. Reports some abd pain.

## 2020-11-04 NOTE — Discharge Instructions (Addendum)
Drink plenty of fluids and follow-up with your family doctor next week if not improved

## 2020-11-12 ENCOUNTER — Other Ambulatory Visit: Payer: Self-pay

## 2020-11-12 ENCOUNTER — Encounter (HOSPITAL_COMMUNITY): Payer: Self-pay

## 2020-11-12 ENCOUNTER — Emergency Department (HOSPITAL_COMMUNITY): Payer: Self-pay

## 2020-11-12 ENCOUNTER — Emergency Department (HOSPITAL_COMMUNITY)
Admission: EM | Admit: 2020-11-12 | Discharge: 2020-11-12 | Disposition: A | Discharge status: Home / Self Care | Payer: Self-pay | Attending: Emergency Medicine | Admitting: Emergency Medicine

## 2020-11-12 DIAGNOSIS — Y92410 Unspecified street and highway as the place of occurrence of the external cause: Secondary | ICD-10-CM | POA: Insufficient documentation

## 2020-11-12 DIAGNOSIS — S39012A Strain of muscle, fascia and tendon of lower back, initial encounter: Secondary | ICD-10-CM | POA: Insufficient documentation

## 2020-11-12 DIAGNOSIS — F17210 Nicotine dependence, cigarettes, uncomplicated: Secondary | ICD-10-CM | POA: Insufficient documentation

## 2020-11-12 DIAGNOSIS — Y990 Civilian activity done for income or pay: Secondary | ICD-10-CM | POA: Insufficient documentation

## 2020-11-12 DIAGNOSIS — X500XXA Overexertion from strenuous movement or load, initial encounter: Secondary | ICD-10-CM | POA: Insufficient documentation

## 2020-11-12 DIAGNOSIS — T148XXA Other injury of unspecified body region, initial encounter: Secondary | ICD-10-CM

## 2020-11-12 DIAGNOSIS — R52 Pain, unspecified: Secondary | ICD-10-CM

## 2020-11-12 MED ORDER — METHOCARBAMOL 500 MG PO TABS
500.0000 mg | ORAL_TABLET | Freq: Once | ORAL | Status: AC
  Administered 2020-11-12: 500 mg via ORAL
  Filled 2020-11-12: qty 1

## 2020-11-12 MED ORDER — NAPROXEN 500 MG PO TABS: 500.0000 mg | ORAL_TABLET | Freq: Two times a day (BID) | ORAL | 0 refills | Status: AC

## 2020-11-12 MED ORDER — IBUPROFEN 800 MG PO TABS
800.0000 mg | ORAL_TABLET | Freq: Once | ORAL | Status: AC
  Administered 2020-11-12: 800 mg via ORAL
  Filled 2020-11-12: qty 1

## 2020-11-12 MED ORDER — METHOCARBAMOL 500 MG PO TABS: 500.0000 mg | ORAL_TABLET | Freq: Three times a day (TID) | ORAL | 0 refills | Status: AC

## 2020-11-12 NOTE — Discharge Instructions (Signed)
Alternate ice and heat to your middle and lower back.  Avoid heavy lifting, bending over, or twisting movements for at least 1 week.  As discussed, your x-ray shows you have some scoliosis of your lower back.  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  You may follow-up with your primary care provider for this.

## 2020-11-12 NOTE — ED Triage Notes (Signed)
Pt reports history of back pain.  Reports he reached for something last night and felt a catch between his shoulder blades.  Says hurts to move.  Reports has had same pain several times before in the past.
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
## 2021-03-11 ENCOUNTER — Ambulatory Visit
Admission: EM | Admit: 2021-03-11 | Discharge: 2021-03-11 | Disposition: A | Discharge status: Home / Self Care | Payer: Self-pay | Attending: Family Medicine | Admitting: Family Medicine

## 2021-03-11 ENCOUNTER — Other Ambulatory Visit: Payer: Self-pay

## 2021-03-11 DIAGNOSIS — M778 Other enthesopathies, not elsewhere classified: Secondary | ICD-10-CM

## 2021-03-11 MED ORDER — DICLOFENAC SODIUM 1 % EX GEL: 2.0000 g | Freq: Four times a day (QID) | CUTANEOUS | 2 refills | Status: AC

## 2021-03-11 MED ORDER — GABAPENTIN 100 MG PO CAPS: 100.0000 mg | ORAL_CAPSULE | Freq: Three times a day (TID) | ORAL | 0 refills | Status: AC | PRN

## 2021-03-11 MED ORDER — PREDNISONE 20 MG PO TABS: 40.0000 mg | ORAL_TABLET | Freq: Every day | ORAL | 0 refills | Status: AC
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
## 2021-03-11 NOTE — ED Triage Notes (Signed)
Pt presents with bilateral hand numbing and tingling that started last night . Denies any injury but does use his hands at work.

## 2021-09-10 DIAGNOSIS — F17200 Nicotine dependence, unspecified, uncomplicated: Secondary | ICD-10-CM | POA: Diagnosis not present

## 2021-09-10 DIAGNOSIS — Z139 Encounter for screening, unspecified: Secondary | ICD-10-CM | POA: Diagnosis not present

## 2021-09-10 DIAGNOSIS — Z0000 Encounter for general adult medical examination without abnormal findings: Secondary | ICD-10-CM | POA: Diagnosis not present

## 2021-10-09 DIAGNOSIS — R109 Unspecified abdominal pain: Secondary | ICD-10-CM | POA: Diagnosis not present

## 2021-10-09 DIAGNOSIS — R197 Diarrhea, unspecified: Secondary | ICD-10-CM | POA: Diagnosis not present

## 2021-10-09 DIAGNOSIS — R110 Nausea: Secondary | ICD-10-CM | POA: Diagnosis not present

## 2021-10-11 ENCOUNTER — Other Ambulatory Visit (HOSPITAL_COMMUNITY): Payer: Self-pay | Admitting: Family Medicine

## 2021-10-11 ENCOUNTER — Other Ambulatory Visit: Payer: Self-pay | Admitting: Family Medicine

## 2021-10-11 DIAGNOSIS — R109 Unspecified abdominal pain: Secondary | ICD-10-CM

## 2021-11-02 ENCOUNTER — Encounter (INDEPENDENT_AMBULATORY_CARE_PROVIDER_SITE_OTHER): Payer: Self-pay | Admitting: *Deleted

## 2021-12-10 ENCOUNTER — Encounter (HOSPITAL_COMMUNITY): Payer: Self-pay

## 2021-12-10 ENCOUNTER — Ambulatory Visit (HOSPITAL_COMMUNITY): Payer: BC Managed Care – PPO

## 2021-12-13 ENCOUNTER — Ambulatory Visit (INDEPENDENT_AMBULATORY_CARE_PROVIDER_SITE_OTHER): Payer: Self-pay | Admitting: Gastroenterology

## 2022-03-19 DIAGNOSIS — J019 Acute sinusitis, unspecified: Secondary | ICD-10-CM | POA: Diagnosis not present

## 2022-07-02 DIAGNOSIS — F17200 Nicotine dependence, unspecified, uncomplicated: Secondary | ICD-10-CM | POA: Diagnosis not present

## 2022-07-02 DIAGNOSIS — R202 Paresthesia of skin: Secondary | ICD-10-CM | POA: Diagnosis not present

## 2022-07-02 DIAGNOSIS — F1910 Other psychoactive substance abuse, uncomplicated: Secondary | ICD-10-CM | POA: Diagnosis not present

## 2022-07-02 DIAGNOSIS — F17210 Nicotine dependence, cigarettes, uncomplicated: Secondary | ICD-10-CM | POA: Diagnosis not present

## 2022-07-02 DIAGNOSIS — J0100 Acute maxillary sinusitis, unspecified: Secondary | ICD-10-CM | POA: Diagnosis not present

## 2022-07-02 DIAGNOSIS — Z91030 Bee allergy status: Secondary | ICD-10-CM | POA: Diagnosis not present

## 2022-07-15 DIAGNOSIS — Z139 Encounter for screening, unspecified: Secondary | ICD-10-CM | POA: Diagnosis not present

## 2022-07-22 DIAGNOSIS — Z0001 Encounter for general adult medical examination with abnormal findings: Secondary | ICD-10-CM | POA: Diagnosis not present

## 2022-07-22 DIAGNOSIS — Z139 Encounter for screening, unspecified: Secondary | ICD-10-CM | POA: Diagnosis not present

## 2022-07-22 DIAGNOSIS — D649 Anemia, unspecified: Secondary | ICD-10-CM | POA: Diagnosis not present

## 2022-07-22 DIAGNOSIS — F17200 Nicotine dependence, unspecified, uncomplicated: Secondary | ICD-10-CM | POA: Diagnosis not present

## 2022-07-22 DIAGNOSIS — F17210 Nicotine dependence, cigarettes, uncomplicated: Secondary | ICD-10-CM | POA: Diagnosis not present

## 2022-07-22 DIAGNOSIS — F1910 Other psychoactive substance abuse, uncomplicated: Secondary | ICD-10-CM | POA: Diagnosis not present

## 2022-11-21 DIAGNOSIS — R059 Cough, unspecified: Secondary | ICD-10-CM | POA: Diagnosis not present

## 2022-11-21 DIAGNOSIS — F17210 Nicotine dependence, cigarettes, uncomplicated: Secondary | ICD-10-CM | POA: Diagnosis not present

## 2022-11-21 DIAGNOSIS — J069 Acute upper respiratory infection, unspecified: Secondary | ICD-10-CM | POA: Diagnosis not present

## 2023-07-21 DIAGNOSIS — Z6822 Body mass index (BMI) 22.0-22.9, adult: Secondary | ICD-10-CM | POA: Diagnosis not present

## 2023-07-21 DIAGNOSIS — R509 Fever, unspecified: Secondary | ICD-10-CM | POA: Diagnosis not present

## 2023-07-21 DIAGNOSIS — J069 Acute upper respiratory infection, unspecified: Secondary | ICD-10-CM | POA: Diagnosis not present

## 2023-07-21 DIAGNOSIS — R030 Elevated blood-pressure reading, without diagnosis of hypertension: Secondary | ICD-10-CM | POA: Diagnosis not present

## 2024-01-03 DIAGNOSIS — J028 Acute pharyngitis due to other specified organisms: Secondary | ICD-10-CM | POA: Diagnosis not present

## 2024-01-03 DIAGNOSIS — J069 Acute upper respiratory infection, unspecified: Secondary | ICD-10-CM | POA: Diagnosis not present

## 2024-01-03 DIAGNOSIS — R0609 Other forms of dyspnea: Secondary | ICD-10-CM | POA: Diagnosis not present
